# Patient Record
Sex: Male | Born: 1985 | Race: Black or African American | Hispanic: No | Marital: Single | Smoking: Current every day smoker
Health system: Southern US, Community
[De-identification: ages and names within clinical notes are randomized; demographics above are authoritative.]

---
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
## 2008-08-12 ENCOUNTER — Emergency Department (HOSPITAL_COMMUNITY): Admission: EM | Admit: 2008-08-12 | Discharge: 2008-08-12 | Payer: Self-pay | Admitting: Emergency Medicine

## 2008-10-23 ENCOUNTER — Emergency Department (HOSPITAL_COMMUNITY): Admission: EM | Admit: 2008-10-23 | Discharge: 2008-10-23 | Payer: Self-pay | Admitting: Emergency Medicine
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
## 2011-02-16 ENCOUNTER — Encounter: Payer: Self-pay | Admitting: Emergency Medicine

## 2011-02-16 ENCOUNTER — Emergency Department (HOSPITAL_COMMUNITY)
Admission: EM | Admit: 2011-02-16 | Discharge: 2011-02-16 | Disposition: A | Discharge status: Home / Self Care | Payer: Self-pay | Attending: Emergency Medicine | Admitting: Emergency Medicine

## 2011-02-16 DIAGNOSIS — R238 Other skin changes: Secondary | ICD-10-CM

## 2011-02-16 DIAGNOSIS — L988 Other specified disorders of the skin and subcutaneous tissue: Secondary | ICD-10-CM | POA: Insufficient documentation

## 2011-02-16 MED ORDER — IBUPROFEN 800 MG PO TABS: 800.0000 mg | ORAL_TABLET | Freq: Three times a day (TID) | ORAL | Status: AC | PRN

## 2011-02-16 MED ORDER — IBUPROFEN 800 MG PO TABS
800.0000 mg | ORAL_TABLET | Freq: Once | ORAL | Status: AC
  Administered 2011-02-16: 800 mg via ORAL
  Filled 2011-02-16: qty 1

## 2011-02-16 MED ORDER — BACITRACIN-NEOMYCIN-POLYMYXIN 400-5-5000 EX OINT
TOPICAL_OINTMENT | Freq: Once | CUTANEOUS | Status: AC
  Administered 2011-02-16: 1 via TOPICAL
  Filled 2011-02-16: qty 1

## 2011-02-16 NOTE — ED Notes (Signed)
Pt c/o rt ear pain x3 days 

## 2011-02-16 NOTE — ED Provider Notes (Signed)
History     CSN: [id number] Arrival date & time: 02/16/2011  6:22 PM  Chief Complaint  Patient presents with  . Otalgia   Patient is a 25 y.o. male presenting with ear pain. The history is provided by the patient.  Otalgia The current episode started more than 2 days ago. There is pain in the right ear. The problem occurs constantly. The problem has not changed since onset.There has been no fever. The pain is at a severity of 5/10. The pain is moderate. Pertinent negatives include no ear discharge, no headaches, no hearing loss, no rhinorrhea, no sore throat, no abdominal pain, no neck pain and no rash.    History reviewed. No pertinent past medical history.  History reviewed. No pertinent past surgical history.  History reviewed. No pertinent family history.  History  Substance Use Topics  . Smoking status: Not on file  . Smokeless tobacco: Not on file  . Alcohol Use: No      Review of Systems  Constitutional: Negative for fever.  HENT: Positive for ear pain. Negative for hearing loss, congestion, sore throat, facial swelling, rhinorrhea, neck pain, neck stiffness and ear discharge.   Eyes: Negative.   Respiratory: Negative for chest tightness and shortness of breath.   Cardiovascular: Negative for chest pain.  Gastrointestinal: Negative for nausea and abdominal pain.  Genitourinary: Negative.   Musculoskeletal: Negative for joint swelling and arthralgias.  Skin: Negative.  Negative for rash and wound.  Neurological: Negative for dizziness, weakness, light-headedness, numbness and headaches.  Hematological: Negative.   Psychiatric/Behavioral: Negative.     Physical Exam  BP 119/65  Pulse 56  Temp(Src) 98.1 F (36.7 C) (Oral)  Resp 20  Ht 5\' 1"  (1.549 m)  Wt 140 lb (63.504 kg)  BMI 26.45 kg/m2  SpO2 100%  Physical Exam  Nursing note and vitals reviewed. Constitutional: He is oriented to person, place, and time. He appears well-developed and well-nourished.    HENT:  Head: Normocephalic and atraumatic.  Right Ear: Tympanic membrane normal. There is swelling and tenderness.  Left Ear: Tympanic membrane and external ear normal.  Mouth/Throat: Oropharynx is clear and moist.       Papule noted at outer verge of right ear canal.  TTP,  No drainage,  No fluctuance.  Eyes: Conjunctivae are normal.  Neck: Normal range of motion.  Cardiovascular: Normal rate, regular rhythm, normal heart sounds and intact distal pulses.   Pulmonary/Chest: Effort normal and breath sounds normal. He has no wheezes.  Abdominal: Soft. Bowel sounds are normal. There is no tenderness.  Musculoskeletal: Normal range of motion.  Neurological: He is alert and oriented to person, place, and time.  Skin: Skin is warm and dry.  Psychiatric: He has a normal mood and affect.    ED Course  Procedures  MDM Papule/pimple right distal ear canal.      Candis Musa, PA 02/16/11 1913

## 2011-02-17 NOTE — ED Provider Notes (Signed)
Medical screening examination/treatment/procedure(s) were performed by non-physician practitioner and as supervising physician I was immediately available for consultation/collaboration. Lus Kriegel, MD, FACEP  Tammie Ellsworth L Antasia Haider, MD 02/17/11 0126 

## 2011-07-08 ENCOUNTER — Emergency Department (HOSPITAL_COMMUNITY)
Admission: EM | Admit: 2011-07-08 | Discharge: 2011-07-08 | Disposition: A | Discharge status: Home / Self Care | Payer: Self-pay | Attending: Emergency Medicine | Admitting: Emergency Medicine

## 2011-07-08 ENCOUNTER — Encounter (HOSPITAL_COMMUNITY): Payer: Self-pay | Admitting: Emergency Medicine

## 2011-07-08 DIAGNOSIS — M545 Low back pain, unspecified: Secondary | ICD-10-CM | POA: Insufficient documentation

## 2011-07-08 DIAGNOSIS — R509 Fever, unspecified: Secondary | ICD-10-CM | POA: Insufficient documentation

## 2011-07-08 DIAGNOSIS — IMO0001 Reserved for inherently not codable concepts without codable children: Secondary | ICD-10-CM | POA: Insufficient documentation

## 2011-07-08 DIAGNOSIS — R51 Headache: Secondary | ICD-10-CM | POA: Insufficient documentation

## 2011-07-08 DIAGNOSIS — R070 Pain in throat: Secondary | ICD-10-CM | POA: Insufficient documentation

## 2011-07-08 DIAGNOSIS — R109 Unspecified abdominal pain: Secondary | ICD-10-CM | POA: Insufficient documentation

## 2011-07-08 DIAGNOSIS — R112 Nausea with vomiting, unspecified: Secondary | ICD-10-CM | POA: Insufficient documentation

## 2011-07-08 MED ORDER — IBUPROFEN 800 MG PO TABS: 800.0000 mg | ORAL_TABLET | Freq: Three times a day (TID) | ORAL | Status: AC

## 2011-07-08 MED ORDER — KETOROLAC TROMETHAMINE 10 MG PO TABS: 10.0000 mg | ORAL_TABLET | Freq: Once | ORAL | Status: DC

## 2011-07-08 MED ORDER — KETOROLAC TROMETHAMINE 30 MG/ML IJ SOLN
30.0000 mg | Freq: Once | INTRAMUSCULAR | Status: AC
  Administered 2011-07-08: 30 mg via INTRAVENOUS

## 2011-07-08 MED ORDER — HYDROMORPHONE HCL PF 1 MG/ML IJ SOLN: 1.0000 mg | Freq: Once | INTRAMUSCULAR | Status: DC

## 2011-07-08 MED ORDER — SODIUM CHLORIDE 0.9 % IV BOLUS (SEPSIS)
1000.0000 mL | Freq: Once | INTRAVENOUS | Status: AC
  Administered 2011-07-08: 1000 mL via INTRAVENOUS

## 2011-07-08 MED ORDER — HYDROMORPHONE HCL PF 2 MG/ML IJ SOLN
INTRAMUSCULAR | Status: AC
  Administered 2011-07-08: 1 mg
  Filled 2011-07-08: qty 1

## 2011-07-08 MED ORDER — KETOROLAC TROMETHAMINE 30 MG/ML IJ SOLN
INTRAMUSCULAR | Status: AC
  Filled 2011-07-08: qty 1

## 2011-07-08 MED ORDER — OSELTAMIVIR PHOSPHATE 75 MG PO CAPS: 75.0000 mg | ORAL_CAPSULE | Freq: Two times a day (BID) | ORAL | Status: AC

## 2011-07-08 MED ORDER — ONDANSETRON HCL 4 MG/2ML IJ SOLN
4.0000 mg | Freq: Once | INTRAMUSCULAR | Status: AC
  Administered 2011-07-08: 4 mg via INTRAVENOUS
  Filled 2011-07-08: qty 2

## 2011-07-08 NOTE — ED Provider Notes (Signed)
This chart was scribed for [employer]. Colon Branch, MD by Williemae Natter. The patient was seen in room APA19/APA19 at 8:29 AM   CSN: [id number]  Arrival date & time 07/08/11  6213   First MD Initiated Contact with Patient 07/08/11 [phone]      Chief Complaint  Patient presents with  . Nausea  . Influenza  . Emesis  . Diarrhea  . Fever    (Consider location/radiation/quality/duration/timing/severity/associated sxs/prior treatment) HPI Garrett Marsh is a 26 y.o. male who presents to the Emergency Department complaining of influenza. Pt stated that his symptoms started around 2:30 this morning. Pt has associated nausea, vomiting, fever, headache, body aches, stomach pains, and lower back pain. Pt denies a sore throat and has not received a flu shot as of yet. No known sick contacts.  History reviewed. No pertinent past medical history.  History reviewed. No pertinent past surgical history.  History reviewed. No pertinent family history.  History  Substance Use Topics  . Smoking status: Never Smoker   . Smokeless tobacco: Not on file  . Alcohol Use: No      Review of Systems 10 Systems reviewed and are negative for acute change except as noted in the HPI.  Allergies  Review of patient's allergies indicates no known allergies.  Home Medications  No current outpatient prescriptions on file. Pulse oximetry on room air is 97%. Normal by my interpretation.   BP 119/75  Pulse 60  Temp(Src) 99 F (37.2 C) (Oral)  Resp 16  Ht 5\' 2"  (1.575 m)  Wt 140 lb (63.504 kg)  BMI 25.61 kg/m2  SpO2 97%  Physical Exam  Nursing note and vitals reviewed. Constitutional: He is oriented to person, place, and time. He appears well-developed and well-nourished.  HENT:  Head: Normocephalic and atraumatic.  Neck: Normal range of motion. Neck supple.  Cardiovascular: Normal rate, regular rhythm and normal heart sounds.  Exam reveals no friction rub.   No murmur heard.      No clicks    Pulmonary/Chest: Effort normal and breath sounds normal.  Neurological: He is alert and oriented to person, place, and time.  Skin: Skin is warm and dry.  Psychiatric: He has a normal mood and affect. His behavior is normal.    ED Course  Procedures (including critical care time) 9:35 AM Recheck: Pt is feeling better and ready for discharge   MDM  Patient with flu symptoms that began last night. Received IVF, antiinflammatory, antiemetic and analgesic with resolution of pain.He has taken PO fluids. Pt feels improved after observation and/or treatment in ED.Pt stable in ED with no significant deterioration in condition.The patient appears reasonably screened and/or stabilized for discharge and I doubt any other medical condition or other [facility] requiring further screening, evaluation, or treatment in the ED at this time prior to discharge.    I personally performed the services described in this documentation, which was scribed in my presence. The recorded information has been reviewed and considered.   MDM Reviewed: nursing note and vitals        Nicoletta Dress. Colon Branch, MD 07/08/11 [phone]

## 2011-07-08 NOTE — ED Notes (Signed)
Pt states flu like symptoms started around 230am.pt states has been around family and friends with same.

## 2011-08-18 ENCOUNTER — Encounter (HOSPITAL_COMMUNITY): Payer: Self-pay | Admitting: *Deleted

## 2011-08-18 ENCOUNTER — Emergency Department (HOSPITAL_COMMUNITY)
Admission: EM | Admit: 2011-08-18 | Discharge: 2011-08-18 | Disposition: A | Discharge status: Home / Self Care | Payer: Self-pay | Attending: Emergency Medicine | Admitting: Emergency Medicine

## 2011-08-18 DIAGNOSIS — B349 Viral infection, unspecified: Secondary | ICD-10-CM

## 2011-08-18 DIAGNOSIS — R51 Headache: Secondary | ICD-10-CM | POA: Insufficient documentation

## 2011-08-18 DIAGNOSIS — B9789 Other viral agents as the cause of diseases classified elsewhere: Secondary | ICD-10-CM | POA: Insufficient documentation

## 2011-08-18 DIAGNOSIS — R1013 Epigastric pain: Secondary | ICD-10-CM | POA: Insufficient documentation

## 2011-08-18 LAB — URINALYSIS, ROUTINE W REFLEX MICROSCOPIC
Hgb urine dipstick: NEGATIVE
Ketones, ur: NEGATIVE mg/dL
Protein, ur: NEGATIVE mg/dL
Urobilinogen, UA: 0.2 mg/dL (ref 0.0–1.0)

## 2011-08-18 LAB — BASIC METABOLIC PANEL
Calcium: 11 mg/dL — ABNORMAL HIGH (ref 8.4–10.5)
Creatinine, Ser: 0.93 mg/dL (ref 0.50–1.35)
GFR calc non Af Amer: >90 mL/min (ref >90)
Glucose, Bld: 99 mg/dL (ref 70–99)
Sodium: 141 mEq/L (ref 135–145)

## 2011-08-18 LAB — CARBOXYHEMOGLOBIN
Carboxyhemoglobin: 1 % (ref 0.5–1.5)
O2 Saturation: 23.8 %
Total hemoglobin: 16 g/dL (ref 13.5–18.0)
Total oxygen content: 26.4 mL/dL — ABNORMAL HIGH (ref 15.0–23.0)

## 2011-08-18 MED ORDER — SODIUM CHLORIDE 0.9 % IV BOLUS (SEPSIS)
1000.0000 mL | Freq: Once | INTRAVENOUS | Status: AC
  Administered 2011-08-18: 1000 mL via INTRAVENOUS

## 2011-08-18 MED ORDER — SODIUM CHLORIDE 0.9 % IV SOLN: Freq: Once | INTRAVENOUS | Status: DC

## 2011-08-18 MED ORDER — DIPHENOXYLATE-ATROPINE 2.5-0.025 MG PO TABS: 1.0000 | ORAL_TABLET | Freq: Four times a day (QID) | ORAL | Status: AC | PRN

## 2011-08-18 MED ORDER — ONDANSETRON HCL 4 MG/2ML IJ SOLN
4.0000 mg | Freq: Once | INTRAMUSCULAR | Status: AC
  Administered 2011-08-18: 4 mg via INTRAVENOUS
  Filled 2011-08-18: qty 2

## 2011-08-18 MED ORDER — ONDANSETRON HCL 4 MG PO TABS: 8.0000 mg | ORAL_TABLET | Freq: Three times a day (TID) | ORAL | Status: AC | PRN

## 2011-08-18 NOTE — ED Notes (Signed)
C/o lower abd pain with n/v/d, headache and weakness x 3 hrs.

## 2011-08-18 NOTE — ED Notes (Signed)
Pt c/o mid abdominal pain, nausea, vomiting and diarrhea for approximately 4 hours prior to arrival. Pt lying on stretcher at this time with no active vomiting. Alert and oriented x 3. Skin warm and dry. Color pink. Breath sounds clear and equal bilaterally. Abdomen soft and non distended.

## 2011-08-18 NOTE — ED Provider Notes (Signed)
History    This chart was scribed for Toy Baker, MD, MD by Smitty Pluck. The patient was seen in room APA11 and the patient's care was started at 3:10PM.   CSN: [id number]  Arrival date & time 08/18/11  1400   First MD Initiated Contact with Patient 08/18/11 1506      Chief Complaint  Patient presents with  . Abdominal Pain  . Headache    (Consider location/radiation/quality/duration/timing/severity/associated sxs/prior treatment) Patient is a 26 y.o. male presenting with abdominal pain and headaches. The history is provided by the patient.  Abdominal Pain The primary symptoms of the illness include abdominal pain.  Headache    Garrett Marsh is a 26 y.o. male who presents to the Emergency Department complaining of moderate lower abdominal pain, headache, diarrhea and vomiting onset today 3 hours ago. He states the diarrhea is watery and loose. Pt reports vomiting 3x today.He reports having generalized weakness. The symptoms have been constant without radiation. He was resting when symptoms started. The vomit is greenish and yellow. He denies fever, cough, earache and sore throat. He was seen in ED before for same symptoms about 1 month ago. He thinks that he had a flu shot. He has sick contact at home with daughter and brother (same symptoms). Pt has gas heating at home.   History reviewed. No pertinent past medical history.  History reviewed. No pertinent past surgical history.  No family history on file.  History  Substance Use Topics  . Smoking status: Never Smoker   . Smokeless tobacco: Not on file  . Alcohol Use: No      Review of Systems  Gastrointestinal: Positive for abdominal pain.  Neurological: Positive for headaches.  All other systems reviewed and are negative.   10 Systems reviewed and are negative for acute change except as noted in the HPI.  Allergies  Review of patient's allergies indicates no known allergies.  Home Medications  No  current outpatient prescriptions on file.  BP 114/60  Pulse 65  Temp(Src) 98.2 F (36.8 C) (Oral)  Resp 20  Ht 5\' 1"  (1.549 m)  Wt 140 lb (63.504 kg)  BMI 26.45 kg/m2  SpO2 100%  Physical Exam  Nursing note and vitals reviewed. Constitutional: He is oriented to person, place, and time. He appears well-developed and well-nourished. No distress.  HENT:  Head: Normocephalic and atraumatic.  Mouth/Throat: Oropharynx is clear and moist.  Eyes: Conjunctivae are normal. Pupils are equal, round, and reactive to light.  Neck: Normal range of motion. Neck supple.  Cardiovascular: Normal rate, regular rhythm and normal heart sounds.   Pulmonary/Chest: Effort normal and breath sounds normal. No respiratory distress.  Abdominal: Soft. Bowel sounds are normal. He exhibits no distension. There is Tenderness: mild epigastric .  Neurological: He is alert and oriented to person, place, and time.  Skin: Skin is warm and dry.  Psychiatric: He has a normal mood and affect. His behavior is normal.    ED Course  Procedures (including critical care time) DIAGNOSTIC STUDIES: Oxygen Saturation is 100% on room air, normal by my interpretation.    COORDINATION OF CARE: 3:15PM EDP ordered medication: Zofran 4 mg, NaCl 0.9% bolus      Labs Reviewed  CARBOXYHEMOGLOBIN - Abnormal; Notable for the following:    Methemoglobin 2.4 (*)    Total oxygen content 26.4 (*)    All other components within normal limits  BASIC METABOLIC PANEL - Abnormal; Notable for the following:    Calcium 11.0 (*)  All other components within normal limits  URINALYSIS, ROUTINE W REFLEX MICROSCOPIC  URINE CULTURE   No results found.   No diagnosis found.    MDM  I personally performed the services described in this documentation, which was scribed in my presence. The recorded information has been reviewed and considered.  Pt given iv fluids and zofran--feels better, no evidence of CO poisioning--notes sick  exposures at home, suspect viral illness        Toy Baker, MD 08/18/11 [phone]

## 2011-08-18 NOTE — Discharge Instructions (Signed)
Antibiotic Nonuse  Your caregiver felt that the infection or problem was not one that would be helped with an antibiotic. Infections may be caused by viruses or bacteria. Only a caregiver can tell which one of these is the likely cause of an illness. A cold is the most common cause of infection in both adults and children. A cold is a virus. Antibiotic treatment will have no effect on a viral infection. Viruses can lead to many lost days of work caring for sick children and many missed days of school. Children may catch as many as 10 "colds" or "flus" per year during which they can be tearful, cranky, and uncomfortable. The goal of treating a virus is aimed at keeping the ill person comfortable. Antibiotics are medications used to help the body fight bacterial infections. There are relatively few types of bacteria that cause infections but there are hundreds of viruses. While both viruses and bacteria cause infection they are very different types of germs. A viral infection will typically go away by itself within 7 to 10 days. Bacterial infections may spread or get worse without antibiotic treatment. Examples of bacterial infections are:  Sore throats (like strep throat or tonsillitis).   Infection in the lung (pneumonia).   Ear and skin infections.  Examples of viral infections are:  Colds or flus.   Most coughs and bronchitis.   Sore throats not caused by Strep.   Runny noses.  It is often best not to take an antibiotic when a viral infection is the cause of the problem. Antibiotics can kill off the helpful bacteria that we have inside our body and allow harmful bacteria to start growing. Antibiotics can cause side effects such as allergies, nausea, and diarrhea without helping to improve the symptoms of the viral infection. Additionally, repeated uses of antibiotics can cause bacteria inside of our body to become resistant. That resistance can be passed onto harmful bacterial. The next time  you have an infection it may be harder to treat if antibiotics are used when they are not needed. Not treating with antibiotics allows our own immune system to develop and take care of infections more efficiently. Also, antibiotics will work better for us when they are prescribed for bacterial infections. Treatments for a child that is ill may include:  Give extra fluids throughout the day to stay hydrated.   Get plenty of rest.   Only give your child over-the-counter or prescription medicines for pain, discomfort, or fever as directed by your caregiver.   The use of a cool mist humidifier may help stuffy noses.   Cold medications if suggested by your caregiver.  Your caregiver may decide to start you on an antibiotic if:  The problem you were seen for today continues for a longer length of time than expected.   You develop a secondary bacterial infection.  SEEK MEDICAL CARE IF:  Fever lasts longer than 5 days.   Symptoms continue to get worse after 5 to 7 days or become severe.   Difficulty in breathing develops.   Signs of dehydration develop (poor drinking, rare urinating, dark colored urine).   Changes in behavior or worsening tiredness (listlessness or lethargy).  Document Released: 08/17/2001 Document Revised: 02/18/2011 Document Reviewed: 02/13/2009 Exit[employer] Patient Information 2012 [employer].Viral Infections A viral infection can be caused by different types of viruses.Most viral infections are not serious and resolve on their own. However, some infections may cause severe symptoms and may lead to further complications. SYMPTOMS Viruses   can frequently cause:  Minor sore throat.   Aches and pains.   Headaches.   Runny nose.   Different types of rashes.   Watery eyes.   Tiredness.   Cough.   Loss of appetite.   Gastrointestinal infections, resulting in nausea, vomiting, and diarrhea.  These symptoms do not respond to antibiotics because the infection  is not caused by bacteria. However, you might catch a bacterial infection following the viral infection. This is sometimes called a "superinfection." Symptoms of such a bacterial infection may include:  Worsening sore throat with pus and difficulty swallowing.   Swollen neck glands.   Chills and a high or persistent fever.   Severe headache.   Tenderness over the sinuses.   Persistent overall ill feeling (malaise), muscle aches, and tiredness (fatigue).   Persistent cough.   Yellow, green, or brown mucus production with coughing.  HOME CARE INSTRUCTIONS   Only take over-the-counter or prescription medicines for pain, discomfort, diarrhea, or fever as directed by your caregiver.   Drink enough water and fluids to keep your urine clear or pale yellow. Sports drinks can provide valuable electrolytes, sugars, and hydration.   Get plenty of rest and maintain proper nutrition. Soups and broths with crackers or rice are fine.  SEEK IMMEDIATE MEDICAL CARE IF:   You have severe headaches, shortness of breath, chest pain, neck pain, or an unusual rash.   You have uncontrolled vomiting, diarrhea, or you are unable to keep down fluids.   You or your child has an oral temperature above 102 F (38.9 C), not controlled by medicine.   Your baby is older than 3 months with a rectal temperature of 102 F (38.9 C) or higher.   Your baby is 3 months old or younger with a rectal temperature of 100.4 F (38 C) or higher.  MAKE SURE YOU:   Understand these instructions.   Will watch your condition.   Will get help right away if you are not doing well or get worse.  Document Released: 03/18/2005 Document Revised: 02/18/2011 Document Reviewed: 10/13/2010 Exit[employer] Patient Information 2012 [employer]. 

## 2011-08-18 NOTE — ED Notes (Signed)
Pt walked to bathroom to have BM.

## 2011-08-19 LAB — URINE CULTURE
Colony Count: NO GROWTH
Culture: NO GROWTH

## 2012-04-11 ENCOUNTER — Emergency Department (HOSPITAL_COMMUNITY)
Admission: EM | Admit: 2012-04-11 | Discharge: 2012-04-11 | Disposition: A | Discharge status: Home / Self Care | Payer: Self-pay | Attending: Emergency Medicine | Admitting: Emergency Medicine

## 2012-04-11 ENCOUNTER — Encounter (HOSPITAL_COMMUNITY): Payer: Self-pay | Admitting: *Deleted

## 2012-04-11 DIAGNOSIS — R05 Cough: Secondary | ICD-10-CM | POA: Insufficient documentation

## 2012-04-11 DIAGNOSIS — J111 Influenza due to unidentified influenza virus with other respiratory manifestations: Secondary | ICD-10-CM | POA: Insufficient documentation

## 2012-04-11 DIAGNOSIS — R059 Cough, unspecified: Secondary | ICD-10-CM | POA: Insufficient documentation

## 2012-04-11 MED ORDER — OSELTAMIVIR PHOSPHATE 75 MG PO CAPS: 75.0000 mg | ORAL_CAPSULE | Freq: Two times a day (BID) | ORAL | Status: AC

## 2012-04-11 MED ORDER — IBUPROFEN 800 MG PO TABS: 800.0000 mg | ORAL_TABLET | Freq: Three times a day (TID) | ORAL | Status: AC

## 2012-04-11 NOTE — ED Notes (Signed)
Fever, back pain,chills

## 2012-04-11 NOTE — ED Provider Notes (Signed)
History     CSN: [id number]  Arrival date & time 04/11/12  2004   First MD Initiated Contact with Patient 04/11/12 2117      Chief Complaint  Patient presents with  . Fever    (Consider location/radiation/quality/duration/timing/severity/associated sxs/prior treatment) Patient is a 26 y.o. male presenting with fever. The history is provided by the patient. No language interpreter was used.  Fever Primary symptoms of the febrile illness include fever and cough. The current episode started today. This is a new problem. The problem has not changed since onset. Pt complains of flu like symptoms.  Pt has had body aches and fever.   Pt complains of a cough  History reviewed. No pertinent past medical history.  History reviewed. No pertinent past surgical history.  History reviewed. No pertinent family history.  History  Substance Use Topics  . Smoking status: Never Smoker   . Smokeless tobacco: Not on file  . Alcohol Use: No      Review of Systems  Constitutional: Positive for fever.  Respiratory: Positive for cough.   All other systems reviewed and are negative.    Allergies  Review of patient's allergies indicates no known allergies.  Home Medications  No current outpatient prescriptions on file.  BP 127/53  Pulse 93  Temp 100.8 F (38.2 C) (Oral)  Resp 20  Ht 5\' 1"  (1.549 m)  Wt 140 lb (63.504 kg)  BMI 26.45 kg/m2  SpO2 97%  Physical Exam  Nursing note and vitals reviewed. Constitutional: He is oriented to person, place, and time. He appears well-developed and well-nourished.  HENT:  Head: Normocephalic and atraumatic.  Right Ear: External ear normal.  Left Ear: External ear normal.  Nose: Nose normal.  Mouth/Throat: Oropharynx is clear and moist.  Eyes: Conjunctivae normal are normal. Pupils are equal, round, and reactive to light.  Neck: Normal range of motion.  Cardiovascular: Normal rate and normal heart sounds.   Pulmonary/Chest: Effort normal.   Abdominal: Soft.  Musculoskeletal: Normal range of motion.  Neurological: He is alert and oriented to person, place, and time.  Skin: Skin is warm.  Psychiatric: He has a normal mood and affect.    ED Course  Procedures (including critical care time)  Labs Reviewed - No data to display No results found.   1. Influenza-like illness       MDM  Ibuprofen 800mg , diflucan,  Out of work x 5 days        Elson Areas, [place] 04/11/12 2153  Lonia Skinner [place], [place] 04/11/12 2156

## 2012-04-13 NOTE — ED Provider Notes (Signed)
Medical screening examination/treatment/procedure(s) were performed by non-physician practitioner and as supervising physician I was immediately available for consultation/collaboration.  Jones Skene, M.D.     Jones Skene, MD 04/13/12 1604

## 2015-12-31 ENCOUNTER — Emergency Department (HOSPITAL_COMMUNITY)
Admission: EM | Admit: 2015-12-31 | Discharge: 2015-12-31 | Disposition: A | Discharge status: Home / Self Care | Payer: Self-pay | Attending: Emergency Medicine | Admitting: Emergency Medicine

## 2015-12-31 ENCOUNTER — Encounter (HOSPITAL_COMMUNITY): Payer: Self-pay | Admitting: Emergency Medicine

## 2015-12-31 DIAGNOSIS — Y939 Activity, unspecified: Secondary | ICD-10-CM | POA: Insufficient documentation

## 2015-12-31 DIAGNOSIS — Y929 Unspecified place or not applicable: Secondary | ICD-10-CM | POA: Insufficient documentation

## 2015-12-31 DIAGNOSIS — F17210 Nicotine dependence, cigarettes, uncomplicated: Secondary | ICD-10-CM | POA: Insufficient documentation

## 2015-12-31 DIAGNOSIS — L259 Unspecified contact dermatitis, unspecified cause: Secondary | ICD-10-CM | POA: Insufficient documentation

## 2015-12-31 DIAGNOSIS — Y999 Unspecified external cause status: Secondary | ICD-10-CM | POA: Insufficient documentation

## 2015-12-31 DIAGNOSIS — S91332A Puncture wound without foreign body, left foot, initial encounter: Secondary | ICD-10-CM | POA: Insufficient documentation

## 2015-12-31 DIAGNOSIS — W450XXA Nail entering through skin, initial encounter: Secondary | ICD-10-CM | POA: Insufficient documentation

## 2015-12-31 MED ORDER — DIPHENHYDRAMINE HCL 25 MG PO TABS: 50.0000 mg | ORAL_TABLET | Freq: Four times a day (QID) | ORAL | Status: AC | PRN

## 2015-12-31 NOTE — ED Notes (Signed)
Pt here with multiple complaints. Pt reports generalized, itchy rash x 5 days with no SOB. Pt also c/o puncture wound to LT foot after stepping on a nail two months ago. Wound appears to be well healed.

## 2015-12-31 NOTE — ED Provider Notes (Signed)
CSN: [id number]     Arrival date & time 12/31/15  0708 History   First MD Initiated Contact with Patient 12/31/15 0710     Chief Complaint  Patient presents with  . Rash  . Foot Pain     (Consider location/radiation/quality/duration/timing/severity/associated sxs/prior Treatment) HPI  30 year old male who presents with rash and puncture wound to the left foot. He has no significant PMH. States that he was four-wheeling a few days ago outside and after coming home had itchy rash over arms and torso. Using hydrocortisone cream for it, and improving. Also has puncture wound to sole of the left foot over 1-2 months. Still has some pain around wound when on foot for extended periods of time. No fever or chills. No increased redness, persistent swelling or purulent drainage. No dyspnea, throat swelling, n/v, chest pain. No new detergents, soaps, lotions, foods. History reviewed. No pertinent past medical history. History reviewed. No pertinent past surgical history. History reviewed. No pertinent family history. Social History  Substance Use Topics  . Smoking status: Current Every Day Smoker -- 1.00 packs/day    Types: Cigarettes  . Smokeless tobacco: None  . Alcohol Use: No    Review of Systems  Constitutional: Negative for fever.  Respiratory: Negative for shortness of breath.   Gastrointestinal: Negative for nausea and vomiting.  Skin: Positive for rash and wound.  Allergic/Immunologic: Negative for immunocompromised state.  Hematological: Does not bruise/bleed easily.  All other systems reviewed and are negative.     Allergies  Review of patient's allergies indicates no known allergies.  Home Medications   Prior to Admission medications   Medication Sig Start Date End Date Taking? Authorizing Provider  diphenhydrAMINE (BENADRYL) 25 MG tablet Take 2 tablets (50 mg total) by mouth every 6 (six) hours as needed for itching. 12/31/15   Lavera Guiseana Duo Margreat Widener, MD  ibuprofen (ADVIL,MOTRIN)  800 MG tablet Take 1 tablet (800 mg total) by mouth 3 (three) times daily. 04/11/12   Elson AreasLeslie K Sofia, PA-C  oseltamivir (TAMIFLU) 75 MG capsule Take 1 capsule (75 mg total) by mouth every 12 (twelve) hours. 04/11/12   Elson AreasLeslie K Sofia, PA-C   BP 117/79 mmHg  Pulse 69  Temp(Src) 97.9 F (36.6 C) (Oral)  Resp 18  SpO2 100% Physical Exam  Physical Exam  Nursing note and vitals reviewed. Constitutional: Well developed, well nourished, non-toxic, and in no acute distress Head: Normocephalic and atraumatic.  Mouth/Throat: Oropharynx is clear and moist.  Neck: Normal range of motion. Neck supple.  Cardiovascular: Normal rate and regular rhythm.  +2 DP pulses Pulmonary/Chest: Effort normal and breath sounds normal.  Abdominal: Soft. There is no tenderness. There is no rebound and no guarding.  Musculoskeletal: Normal range of motion.  Neurological: Alert, no facial droop, fluent speech, moves all extremities symmetrically Skin: Skin is warm and dry. Patches of maculopapular rash with dried skin scattered over back, chest and left upper arm. No drainage. There is well-healing puncture wound to distal sole of the left foot without surrounding skin changes or drainage. Psychiatric: Cooperative   ED Course  Procedures (including critical care time) Labs Review Labs Reviewed - No data to display  Imaging Review No results found. I have personally reviewed and evaluated these images and lab results as part of my medical decision-making.   EKG Interpretation None      MDM   Final diagnoses:  Contact dermatitis  Puncture wound of foot, left, initial encounter    Old puncture wound, well  healing. No signs of infection. Ambulating normally. Will take ibuprofen and tylenol prn for pain and continue supportive care.  Rash likely contact dermatitis as patient outside a few days prior to onset of rash. Well healing with hydrocortisone cream. No mucous membranes involvement. Does not look like  dangerous rash. Will take benadryl prn and continue hydrocortisone cream  Strict return and follow-up instructions reviewed. He expressed understanding of all discharge instructions and felt comfortable with the plan of care.      Lavera Guise, MD 12/31/15 [id number]

## 2015-12-31 NOTE — Discharge Instructions (Signed)
Your rash is likely allergic in nature to something you came into contact with outside a few days ago. Continue hydrocortisone cream as you have been doing, and take benadryl as needed for itching.  Your puncture wound is well healing. Elevate foot if there is some increased swelling after being on foot longer. Take ibuprofen and tylenol as needed for pain.  Watch for signs of infection, including fever, increased redness/swelling, pus drainage  Contact Dermatitis Dermatitis is redness, soreness, and swelling (inflammation) of the skin. Contact dermatitis is a reaction to certain substances that touch the skin. There are two types of contact dermatitis:   Irritant contact dermatitis. This type is caused by something that irritates your skin, such as dry hands from washing them too much. This type does not require previous exposure to the substance for a reaction to occur. This type is more common.  Allergic contact dermatitis. This type is caused by a substance that you are allergic to, such as a nickel allergy or poison ivy. This type only occurs if you have been exposed to the substance (allergen) before. Upon a repeat exposure, your body reacts to the substance. This type is less common. CAUSES  Many different substances can cause contact dermatitis. Irritant contact dermatitis is most commonly caused by exposure to:   Makeup.   Soaps.   Detergents.   Bleaches.   Acids.   Metal salts, such as nickel.  Allergic contact dermatitis is most commonly caused by exposure to:   Poisonous plants.   Chemicals.   Jewelry.   Latex.   Medicines.   Preservatives in products, such as clothing.  RISK FACTORS This condition is more likely to develop in:   People who have jobs that expose them to irritants or allergens.  People who have certain medical conditions, such as asthma or eczema.  SYMPTOMS  Symptoms of this condition may occur anywhere on your body where the  irritant has touched you or is touched by you. Symptoms include:  Dryness or flaking.   Redness.   Cracks.   Itching.   Pain or a burning feeling.   Blisters.  Drainage of small amounts of blood or clear fluid from skin cracks. With allergic contact dermatitis, there may also be swelling in areas such as the eyelids, mouth, or genitals.  DIAGNOSIS  This condition is diagnosed with a medical history and physical exam. A patch skin test may be performed to help determine the cause. If the condition is related to your job, you may need to see an occupational medicine specialist. TREATMENT Treatment for this condition includes figuring out what caused the reaction and protecting your skin from further contact. Treatment may also include:   Steroid creams or ointments. Oral steroid medicines may be needed in more severe cases.  Antibiotics or antibacterial ointments, if a skin infection is present.  Antihistamine lotion or an antihistamine taken by mouth to ease itching.  A bandage (dressing). HOME CARE INSTRUCTIONS Skin Care  Moisturize your skin as needed.   Apply cool compresses to the affected areas.  Try taking a bath with:  Epsom salts. Follow the instructions on the packaging. You can get these at your local pharmacy or grocery store.  Baking soda. Pour a small amount into the bath as directed by your health care provider.  Colloidal oatmeal. Follow the instructions on the packaging. You can get this at your local pharmacy or grocery store.  Try applying baking soda paste to your skin. Stir water into  baking soda until it reaches a paste-like consistency.  Do not scratch your skin.  Bathe less frequently, such as every other day.  Bathe in lukewarm water. Avoid using hot water. Medicines  Take or apply over-the-counter and prescription medicines only as told by your health care provider.   If you were prescribed an antibiotic medicine, take or apply  your antibiotic as told by your health care provider. Do not stop using the antibiotic even if your condition starts to improve. General Instructions  Keep all follow-up visits as told by your health care provider. This is important.  Avoid the substance that caused your reaction. If you do not know what caused it, keep a journal to try to track what caused it. Write down:  What you eat.  What cosmetic products you use.  What you drink.  What you wear in the affected area. This includes jewelry.  If you were given a dressing, take care of it as told by your health care provider. This includes when to change and remove it. SEEK MEDICAL CARE IF:   Your condition does not improve with treatment.  Your condition gets worse.  You have signs of infection such as swelling, tenderness, redness, soreness, or warmth in the affected area.  You have a fever.  You have new symptoms. SEEK IMMEDIATE MEDICAL CARE IF:   You have a severe headache, neck pain, or neck stiffness.  You vomit.  You feel very sleepy.  You notice red streaks coming from the affected area.  Your bone or joint underneath the affected area becomes painful after the skin has healed.  The affected area turns darker.  You have difficulty breathing.   This information is not intended to replace advice given to you by your health care provider. Make sure you discuss any questions you have with your health care provider.   Document Released: 06/05/2000 Document Revised: 02/27/2015 Document Reviewed: 10/24/2014 Elsevier Interactive Patient Education 2016 [employer]  Puncture Wound A puncture wound is an injury that is caused by a sharp, thin object that goes through (penetrates) your skin. Usually, a puncture wound does not leave a large opening in your skin, so it may not bleed a lot. However, when you get a puncture wound, dirt or other materials (foreign bodies) can be forced into your wound and break off  inside. This increases the chance of infection, such as tetanus. CAUSES Puncture wounds are caused by any sharp, thin object that goes through your skin, such as:  Animal teeth, as with an animal bite.  Sharp, pointed objects, such as nails, splinters of glass, fishhooks, and needles. SYMPTOMS Symptoms of a puncture wound include:  Pain.  Bleeding.  Swelling.  Bruising.  Fluid leaking from the wound.  Numbness, tingling, or loss of function. DIAGNOSIS This condition is diagnosed with a medical history and physical exam. Your wound will be checked to see if it contains any foreign bodies. You may also have X-rays or other imaging tests. TREATMENT Treatment for a puncture wound depends on how serious the wound is. It also depends on whether the wound contains any foreign bodies. Treatment for all types of puncture wounds usually starts with:  Controlling the bleeding.  Washing out the wound with a germ-free (sterile) salt-water solution.  Checking the wound for foreign bodies. Treatment may also include:  Having the wound opened surgically to remove a foreign object.  Closing the wound with stitches (sutures) if it continues to bleed.  Covering the wound  with antibiotic ointments and a bandage (dressing).  Receiving a tetanus shot.  Receiving a rabies vaccine. HOME CARE INSTRUCTIONS Medicines  Take or apply over-the-counter and prescription medicines only as told by your health care provider.  If you were prescribed an antibiotic, take or apply it as told by your health care provider. Do not stop using the antibiotic even if your condition improves. Wound Care  There are many ways to close and cover a wound. For example, a wound can be covered with sutures, skin glue, or adhesive strips. Follow instructions from your health care provider about:  How to take care of your wound.  When and how you should change your dressing.  When you should remove your  dressing.  Removing whatever was used to close your wound.  Keep the dressing dry as told by your health care provider. Do not take baths, swim, use a hot tub, or do anything that would put your wound underwater until your health care provider approves.  Clean the wound as told by your health care provider.  Do not scratch or pick at the wound.  Check your wound every day for signs of infection. Watch for:  Redness, swelling, or pain.  Fluid, blood, or pus. General Instructions  Raise (elevate) the injured area above the level of your heart while you are sitting or lying down.  If your puncture wound is in your foot, ask your health care provider if you need to avoid putting weight on your foot and for how long.  Keep all follow-up visits as told by your health care provider. This is important. SEEK MEDICAL CARE IF:  You received a tetanus shot and you have swelling, severe pain, redness, or bleeding at the injection site.  You have a fever.  Your sutures come out.  You notice a bad smell coming from your wound or your dressing.  You notice something coming out of your wound, such as wood or glass.  Your pain is not controlled with medicine.  You have increased redness, swelling, or pain at the site of your wound.  You have fluid, blood, or pus coming from your wound.  You notice a change in the color of your skin near your wound.  You need to change the dressing frequently due to fluid, blood, or pus draining from your wound.  You develop a new rash.  You develop numbness around your wound. SEEK IMMEDIATE MEDICAL CARE IF:  You develop severe swelling around your wound.  Your pain suddenly increases and is severe.  You develop painful skin lumps.  You have a red streak going away from your wound.  The wound is on your hand or foot and you cannot properly move a finger or toe.  The wound is on your hand or foot and you notice that your fingers or toes look  pale or bluish.   This information is not intended to replace advice given to you by your health care provider. Make sure you discuss any questions you have with your health care provider.   Document Released: 03/18/2005 Document Revised: 02/27/2015 Document Reviewed: 08/01/2014 Else[employer] Interactive Patient Education [employer]
